# Patient Record
Sex: Male | Born: 2000 | Race: White | Hispanic: No | Marital: Single | Smoking: Current some day smoker
Health system: Southern US, Community
[De-identification: ages and names within clinical notes are randomized; demographics above are authoritative.]

---

## 2018-07-13 ENCOUNTER — Other Ambulatory Visit: Payer: Self-pay

## 2018-07-13 ENCOUNTER — Emergency Department: Payer: BLUE CROSS/BLUE SHIELD

## 2018-07-13 ENCOUNTER — Encounter: Payer: Self-pay | Admitting: Emergency Medicine

## 2018-07-13 ENCOUNTER — Emergency Department
Admission: EM | Admit: 2018-07-13 | Discharge: 2018-07-13 | Disposition: A | Discharge status: Home / Self Care | Payer: BLUE CROSS/BLUE SHIELD | Attending: Emergency Medicine | Admitting: Emergency Medicine

## 2018-07-13 DIAGNOSIS — R509 Fever, unspecified: Secondary | ICD-10-CM | POA: Diagnosis present

## 2018-07-13 DIAGNOSIS — J020 Streptococcal pharyngitis: Secondary | ICD-10-CM | POA: Diagnosis not present

## 2018-07-13 DIAGNOSIS — F17210 Nicotine dependence, cigarettes, uncomplicated: Secondary | ICD-10-CM | POA: Diagnosis not present

## 2018-07-13 LAB — INFLUENZA PANEL BY PCR (TYPE A & B)
Influenza A By PCR: NEGATIVE
Influenza B By PCR: NEGATIVE

## 2018-07-13 LAB — GROUP A STREP BY PCR: Group A Strep by PCR: DETECTED — AB

## 2018-07-13 MED ORDER — AMOXICILLIN 875 MG PO TABS: 875.0000 mg | ORAL_TABLET | Freq: Two times a day (BID) | ORAL | 0 refills | Status: AC

## 2018-07-13 MED ORDER — AMOXICILLIN 500 MG PO CAPS
500.0000 mg | ORAL_CAPSULE | Freq: Once | ORAL | Status: AC
  Administered 2018-07-13: 500 mg via ORAL
  Filled 2018-07-13: qty 1

## 2018-07-13 NOTE — ED Triage Notes (Signed)
Pt presents to ED with fever and sore throat for the past 5 days. otc medications not helping.

## 2018-07-13 NOTE — ED Notes (Signed)
Pt returned from xray

## 2018-07-13 NOTE — ED Provider Notes (Signed)
[facility] Emergency Department Provider Note  ____________________________________________  Time seen: Approximately 11:01 PM  I have reviewed the triage vital signs and the nursing notes.   HISTORY  Chief Complaint Fever    HPI Wesley Dominguez is a 18 y.o. male presents to the emergency department with pharyngitis, fever anterior neck discomfort for approximately 5 days.  Patient has had mild headache but no abdominal discomfort.  No associated rhinorrhea, congestion or nonproductive cough.  Patient is resting comfortably in the emergency department.  He is tolerating fluids by mouth and his own secretions.  He speaks in complete sentences without difficulty.  No voice changes.  No prior history of peritonsillar or retropharyngeal abscesses.   History reviewed. No pertinent past medical history.  There are no active problems to display for this patient.   History reviewed. No pertinent surgical history.  Prior to Admission medications   Medication Sig Start Date End Date Taking? Authorizing Provider  amoxicillin (AMOXIL) 875 MG tablet Take 1 tablet (875 mg total) by mouth 2 (two) times daily for 7 days. 07/13/18 07/20/18  Orvil Feil, PA-C    Allergies Patient has no known allergies.  No family history on file.  Social History Social History   Tobacco Use  . Smoking status: Current Some Day Smoker    Types: Cigarettes  . Smokeless tobacco: Never Used  Substance Use Topics  . Alcohol use: Yes  . Drug use: Not Currently     Review of Systems  Constitutional: Patient has fever and chills.  Eyes: No visual changes. No discharge ENT: Patient has pharyngitis.  Cardiovascular: no chest pain. Respiratory: no cough. No SOB. Gastrointestinal: No abdominal pain.  No nausea, no vomiting.  No diarrhea.  No constipation. Genitourinary: Negative for dysuria. No hematuria Skin: Negative for rash, abrasions, lacerations, ecchymosis. Neurological:  Negative for headaches, focal weakness or numbness.   ____________________________________________   PHYSICAL EXAM:  VITAL SIGNS: ED Triage Vitals  Enc Vitals Group     BP 07/13/18 2103 132/67     Pulse Rate 07/13/18 2103 71     Resp 07/13/18 2103 20     Temp 07/13/18 2103 99.3 F (37.4 C)     Temp src --      SpO2 07/13/18 2103 99 %     Weight 07/13/18 2103 142 lb (64.4 kg)     Height 07/13/18 2103 5\' 7"  (1.702 m)     Head Circumference --      Peak Flow --      Pain Score 07/13/18 2106 7     Pain Loc --      Pain Edu? --      Excl. in GC? --      Constitutional: Alert and oriented. Well appearing and in no acute distress. Eyes: Conjunctivae are normal. PERRL. EOMI. Head: Atraumatic. ENT:      Ears: TMs are pearly.      Nose: No congestion/rhinnorhea.      Mouth/Throat: Mucous membranes are moist.  Posterior pharynx is erythematous with bilateral tonsillar hypertrophy.  No tonsillar exudate.  Posterior pharynx is erythematous.  Uvula is midline. Hematological/Lymphatic/Immunilogical: Palpable cervical lymphadenopathy.  Cardiovascular: Normal rate, regular rhythm. Normal S1 and S2.  Good peripheral circulation. Respiratory: Normal respiratory effort without tachypnea or retractions. Lungs CTAB. Good air entry to the bases with no decreased or absent breath sounds. Skin:  Skin is warm, dry and intact. No rash noted. Psychiatric: Mood and affect are normal. Speech and behavior are normal.  Patient exhibits appropriate insight and judgement.   ____________________________________________   LABS (all labs ordered are listed, but only abnormal results are displayed)  Labs Reviewed  GROUP A STREP BY PCR - Abnormal; Notable for the following components:      Result Value   Group A Strep by PCR DETECTED (*)    All other components within normal limits  INFLUENZA PANEL BY PCR (TYPE A & B)    ____________________________________________  EKG   ____________________________________________  RADIOLOGY I personally viewed and evaluated these images as part of my medical decision making, as well as reviewing the written report by the radiologist.  Dg Chest 2 View  Result Date: 07/13/2018 CLINICAL DATA:  Fever and sore throat EXAM: CHEST - 2 VIEW COMPARISON:  None. FINDINGS: The heart size and mediastinal contours are within normal limits. Both lungs are clear. The visualized skeletal structures are unremarkable. IMPRESSION: No active cardiopulmonary disease. Electronically Signed   By: Jasmine Pang M.D.   On: 07/13/2018 21:37    ____________________________________________    PROCEDURES  Procedure(s) performed:    Procedures    Medications  amoxicillin (AMOXIL) capsule 500 mg (500 mg Oral Given 07/13/18 2231)     ____________________________________________   INITIAL IMPRESSION / ASSESSMENT AND PLAN / ED COURSE  Pertinent labs & imaging results that were available during my care of the patient were reviewed by me and considered in my medical decision making (see chart for details).  Review of the [place] CSRS was performed in accordance of the [facility] prior to dispensing any controlled drugs.      Assessment and plan:  Strep throat Patient presents to the emergency department with pharyngitis and fever for the past 5 days.  On physical exam, patient had no nuchal rigidity.  He was talking in complete sentences with no voice changes.  No swelling at the neck.  Patient tested positive for group A strep in the emergency department.  Patient was given his first dose of amoxicillin in the emergency department.  He was discharged with amoxicillin.  Tylenol was recommended for pharyngitis.  All patient questions were answered.    ____________________________________________  FINAL CLINICAL IMPRESSION(S) / ED DIAGNOSES  Final diagnoses:  Strep throat      NEW  MEDICATIONS STARTED DURING THIS VISIT:  ED Discharge Orders         Ordered    amoxicillin (AMOXIL) 875 MG tablet  2 times daily     07/13/18 2228              This chart was dictated using voice recognition software/Dragon. Despite best efforts to proofread, errors can occur which can change the meaning. Any change was purely unintentional.    Orvil Feil, PA-C 07/13/18 2305    Rockne Menghini, MD 07/17/18 [phone]

## 2018-07-13 NOTE — ED Notes (Signed)
Pt ambulatory to xray.

## 2019-02-16 ENCOUNTER — Other Ambulatory Visit: Payer: Self-pay

## 2019-02-16 DIAGNOSIS — Z20822 Contact with and (suspected) exposure to covid-19: Secondary | ICD-10-CM

## 2019-02-17 LAB — NOVEL CORONAVIRUS, NAA: SARS-CoV-2, NAA: NOT DETECTED

## 2019-02-19 ENCOUNTER — Telehealth: Payer: Self-pay

## 2019-02-19 NOTE — Telephone Encounter (Signed)
Patient called in requesting [place] lab results - DOB/Address verified - results given. Assisted with MyChart set up, no further questions.

## 2019-08-05 ENCOUNTER — Other Ambulatory Visit: Payer: Self-pay | Admitting: Medical

## 2019-08-08 LAB — CHLAMYDIA/GONOCOCCUS/TRICHOMONAS, NAA
Chlamydia by NAA: NEGATIVE
Gonococcus by NAA: POSITIVE — AB
Trich vag by NAA: NEGATIVE

## 2020-03-30 ENCOUNTER — Emergency Department
Admission: EM | Admit: 2020-03-30 | Discharge: 2020-03-30 | Disposition: A | Discharge status: Home / Self Care | Payer: BC Managed Care – PPO | Attending: Emergency Medicine | Admitting: Emergency Medicine

## 2020-03-30 ENCOUNTER — Emergency Department: Payer: BC Managed Care – PPO

## 2020-03-30 ENCOUNTER — Other Ambulatory Visit: Payer: Self-pay

## 2020-03-30 ENCOUNTER — Encounter: Payer: Self-pay | Admitting: Emergency Medicine

## 2020-03-30 DIAGNOSIS — B2790 Infectious mononucleosis, unspecified without complication: Secondary | ICD-10-CM | POA: Insufficient documentation

## 2020-03-30 DIAGNOSIS — F17210 Nicotine dependence, cigarettes, uncomplicated: Secondary | ICD-10-CM | POA: Insufficient documentation

## 2020-03-30 DIAGNOSIS — M545 Low back pain, unspecified: Secondary | ICD-10-CM | POA: Insufficient documentation

## 2020-03-30 DIAGNOSIS — Z20822 Contact with and (suspected) exposure to covid-19: Secondary | ICD-10-CM | POA: Diagnosis not present

## 2020-03-30 DIAGNOSIS — R059 Cough, unspecified: Secondary | ICD-10-CM | POA: Diagnosis present

## 2020-03-30 LAB — CBC
HCT: 46 % (ref 39.0–52.0)
Hemoglobin: 15.3 g/dL (ref 13.0–17.0)
MCH: 29.4 pg (ref 26.0–34.0)
MCHC: 33.3 g/dL (ref 30.0–36.0)
MCV: 88.3 fL (ref 80.0–100.0)
Platelets: 197 10*3/uL (ref 150–400)
RBC: 5.21 MIL/uL (ref 4.22–5.81)
RDW: 12 % (ref 11.5–15.5)
WBC: 9.6 10*3/uL (ref 4.0–10.5)
nRBC: 0 % (ref 0.0–0.2)

## 2020-03-30 LAB — MONONUCLEOSIS SCREEN: Mono Screen: POSITIVE — AB

## 2020-03-30 LAB — FIBRIN DERIVATIVES D-DIMER (ARMC ONLY): Fibrin derivatives D-dimer (ARMC): 119.13 ng/mL (FEU) (ref 0.00–499.00)

## 2020-03-30 LAB — BASIC METABOLIC PANEL
Anion gap: 9 (ref 5–15)
BUN: 20 mg/dL (ref 6–20)
CO2: 29 mmol/L (ref 22–32)
Calcium: 9.7 mg/dL (ref 8.9–10.3)
Chloride: 99 mmol/L (ref 98–111)
Creatinine, Ser: 1.34 mg/dL — ABNORMAL HIGH (ref 0.61–1.24)
GFR, Estimated: >60 mL/min (ref >60)
Glucose, Bld: 81 mg/dL (ref 70–99)
Potassium: 4.2 mmol/L (ref 3.5–5.1)
Sodium: 137 mmol/L (ref 135–145)

## 2020-03-30 LAB — TROPONIN I (HIGH SENSITIVITY): Troponin I (High Sensitivity): <2 ng/L (ref <18)

## 2020-03-30 LAB — RESPIRATORY PANEL BY RT PCR (FLU A&B, COVID)
Influenza A by PCR: NEGATIVE
Influenza B by PCR: NEGATIVE
SARS Coronavirus 2 by RT PCR: NEGATIVE

## 2020-03-30 MED ORDER — LIDOCAINE VISCOUS HCL 2 % MT SOLN: 10.0000 mL | OROMUCOSAL | 0 refills | Status: AC | PRN

## 2020-03-30 NOTE — ED Triage Notes (Signed)
Pt arrived via POV with c/o cough x 2 weeks and stabbing upper back pain. No distress noted on arrival, pt able to speak in complete sentences without running out of breath.

## 2020-03-30 NOTE — ED Notes (Signed)
Pt ambulatory to triage room, NAD- pt having cough x2 weeks and back pain

## 2020-03-30 NOTE — ED Provider Notes (Signed)
[facility] Emergency Department Provider Note  ____________________________________________  Time seen: Approximately 1:07 PM  I have reviewed the triage vital signs and the nursing notes.   HISTORY  Chief Complaint Cough and Back Pain    HPI Wesley Dominguez is a 19 y.o. male that presents to the emergency department for evaluation of fatigue, upper back pain, productive cough with phlegm for 2 weeks.  Patient states that symptoms started out as a tickle to the front of his throat.  Upper back pain is worse when he coughs.  He has several sick contacts.  A couple of his friends have mono.  When he started feeling bad a couple of weeks ago, he thought that it was his lifestyle so he started eating healthier and working out.  He has not checked his temperature.  No nasal congestion, sore throat, vomiting.   History reviewed. No pertinent past medical history.  There are no problems to display for this patient.   History reviewed. No pertinent surgical history.  Prior to Admission medications   Medication Sig Start Date End Date Taking? Authorizing Provider  lidocaine (XYLOCAINE) 2 % solution Use as directed 10 mLs in the mouth or throat as needed. 03/30/20   Enid Derry, PA-C    Allergies Patient has no known allergies.  No family history on file.  Social History Social History   Tobacco Use  . Smoking status: Current Some Day Smoker    Types: Cigarettes  . Smokeless tobacco: Never Used  Vaping Use  . Vaping Use: Never used  Substance Use Topics  . Alcohol use: Yes  . Drug use: Not Currently     Review of Systems  Constitutional: No fever/chills ENT: No upper respiratory complaints. Cardiovascular: No chest pain. Respiratory: Positive for cough. No SOB. Gastrointestinal: No abdominal pain.  No nausea, no vomiting.  Musculoskeletal: Positive for back pain. Skin: Negative for rash, abrasions, lacerations, ecchymosis. Neurological: Negative  for headaches   ____________________________________________   PHYSICAL EXAM:  VITAL SIGNS: ED Triage Vitals  Enc Vitals Group     BP 03/30/20 1157 125/62     Pulse Rate 03/30/20 1157 82     Resp 03/30/20 1157 16     Temp 03/30/20 1157 97.9 F (36.6 C)     Temp Source 03/30/20 1157 Oral     SpO2 03/30/20 1157 100 %     Weight 03/30/20 1156 135 lb (61.2 kg)     Height 03/30/20 1156 5\' 7"  (1.702 m)     Head Circumference --      Peak Flow --      Pain Score 03/30/20 1156 6     Pain Loc --      Pain Edu? --      Excl. in GC? --      Constitutional: Alert and oriented. Well appearing and in no acute distress.  Working on a laptop. Eyes: Conjunctivae are normal. PERRL. EOMI. Head: Atraumatic. ENT:      Ears:      Nose: No congestion/rhinnorhea.      Mouth/Throat: Mucous membranes are moist.  Oropharynx mildly erythematous.  Tonsils not enlarged.  Uvula midline. Neck: No stridor.  Cardiovascular: Normal rate, regular rhythm.  Good peripheral circulation. Respiratory: Normal respiratory effort without tachypnea or retractions. Lungs CTAB. Good air entry to the bases with no decreased or absent breath sounds. Gastrointestinal: Bowel sounds 4 quadrants. Soft and nontender to palpation. No guarding or rigidity. No palpable masses. No distention.  Musculoskeletal: Full  range of motion to all extremities. No gross deformities appreciated. Neurologic:  Normal speech and language. No gross focal neurologic deficits are appreciated.  Skin:  Skin is warm, dry and intact. No rash noted. Psychiatric: Mood and affect are normal. Speech and behavior are normal. Patient exhibits appropriate insight and judgement.   ____________________________________________   LABS (all labs ordered are listed, but only abnormal results are displayed)  Labs Reviewed  BASIC METABOLIC PANEL - Abnormal; Notable for the following components:      Result Value   Creatinine, Ser 1.34 (*)    All other  components within normal limits  MONONUCLEOSIS SCREEN - Abnormal; Notable for the following components:   Mono Screen POSITIVE (*)    All other components within normal limits  RESPIRATORY PANEL BY RT PCR (FLU A&B, COVID)  CBC  FIBRIN DERIVATIVES D-DIMER ([facility] ONLY)  TROPONIN I (HIGH SENSITIVITY)  TROPONIN I (HIGH SENSITIVITY)   ____________________________________________  EKG  SR ____________________________________________  RADIOLOGY Lexine Baton, personally viewed and evaluated these images (plain radiographs) as part of my medical decision making, as well as reviewing the written report by the radiologist.  DG Chest 2 View  Result Date: 03/30/2020 CLINICAL DATA:  Patient complains of cough x 2 weeks and stabbing upper back pain. Patient state ex Vaper as of 1 month ago. EXAM: CHEST - 2 VIEW COMPARISON:  Chest radiograph 07/13/2018 FINDINGS: The cardiomediastinal contours are within normal limits. The lungs are clear. No pneumothorax or pleural effusion. No acute finding in the visualized skeleton. IMPRESSION: No evidence of active disease. Electronically Signed   By: Emmaline Kluver M.D.   On: 03/30/2020 13:04    ____________________________________________    PROCEDURES  Procedure(s) performed:    Procedures    Medications - No data to display   ____________________________________________   INITIAL IMPRESSION / ASSESSMENT AND PLAN / ED COURSE  Pertinent labs & imaging results that were available during my care of the patient were reviewed by me and considered in my medical decision making (see chart for details).  Review of the [place] CSRS was performed in accordance of the [facility] prior to dispensing any controlled drugs.     Patient presented to the emergency department for evaluation of cough and back pain for 2 weeks.  Vital signs and exam are reassuring.  Chest x-ray negative for acute cardiopulmonary processes.  Normal sinus rhythm on EKG.  Lab work is  unremarkable.  Troponin and D-dimer within normal limits.  Mono test is positive.  Cough is likely due to the throat irritation.  Patient is to follow up with primary care as directed. Patient is given ED precautions to return to the ED for any worsening or new symptoms.   Wesley Dominguez was evaluated in Emergency Department on 03/30/2020 for the symptoms described in the history of present illness. He was evaluated in the context of the global COVID-19 pandemic, which necessitated consideration that the patient might be at risk for infection with the SARS-CoV-2 virus that causes COVID-19. Institutional protocols and algorithms that pertain to the evaluation of patients at risk for COVID-19 are in a state of rapid change based on information released by regulatory bodies including the CDC and federal and state organizations. These policies and algorithms were followed during the patient's care in the ED.  ____________________________________________  FINAL CLINICAL IMPRESSION(S) / ED DIAGNOSES  Final diagnoses:  Infectious mononucleosis without complication, infectious mononucleosis due to unspecified organism      NEW MEDICATIONS STARTED DURING THIS VISIT:  ED Discharge Orders         Ordered    lidocaine (XYLOCAINE) 2 % solution  As needed        03/30/20 1548              This chart was dictated using voice recognition software/Dragon. Despite best efforts to proofread, errors can occur which can change the meaning. Any change was purely unintentional.    Enid Derry, PA-C 03/30/20 1714    Gilles Chiquito, MD 03/31/20 [phone]

## 2022-05-13 IMAGING — CR DG CHEST 2V
2 series · 2 of 2 positions shown · non-contrast
Comparison: Chest radiograph 07/13/2018

CLINICAL DATA: Patient complains of cough x 2 weeks and stabbing
upper back pain. Patient state ex Vaper as of 1 month ago.

EXAM:
CHEST - 2 VIEW

[chest pa]
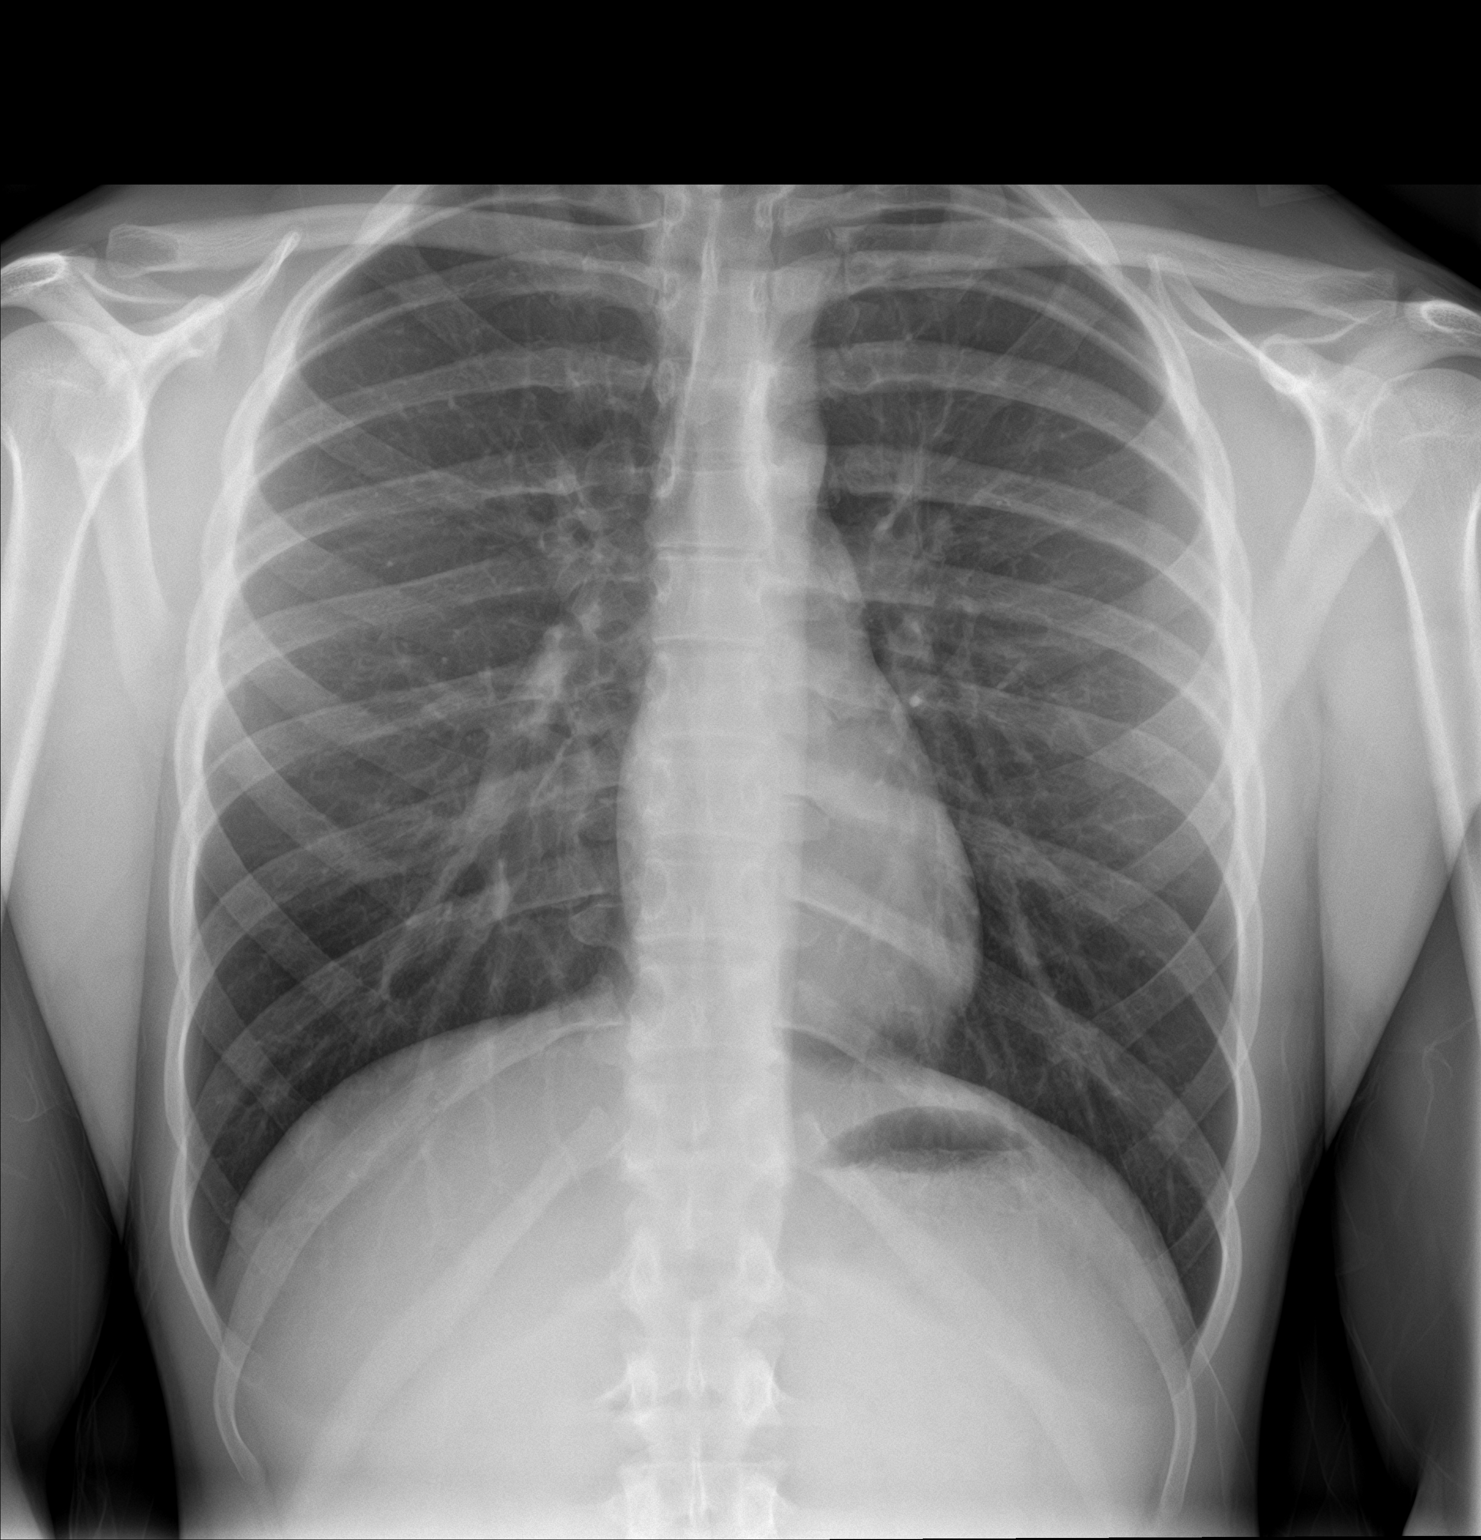

[chest lat]
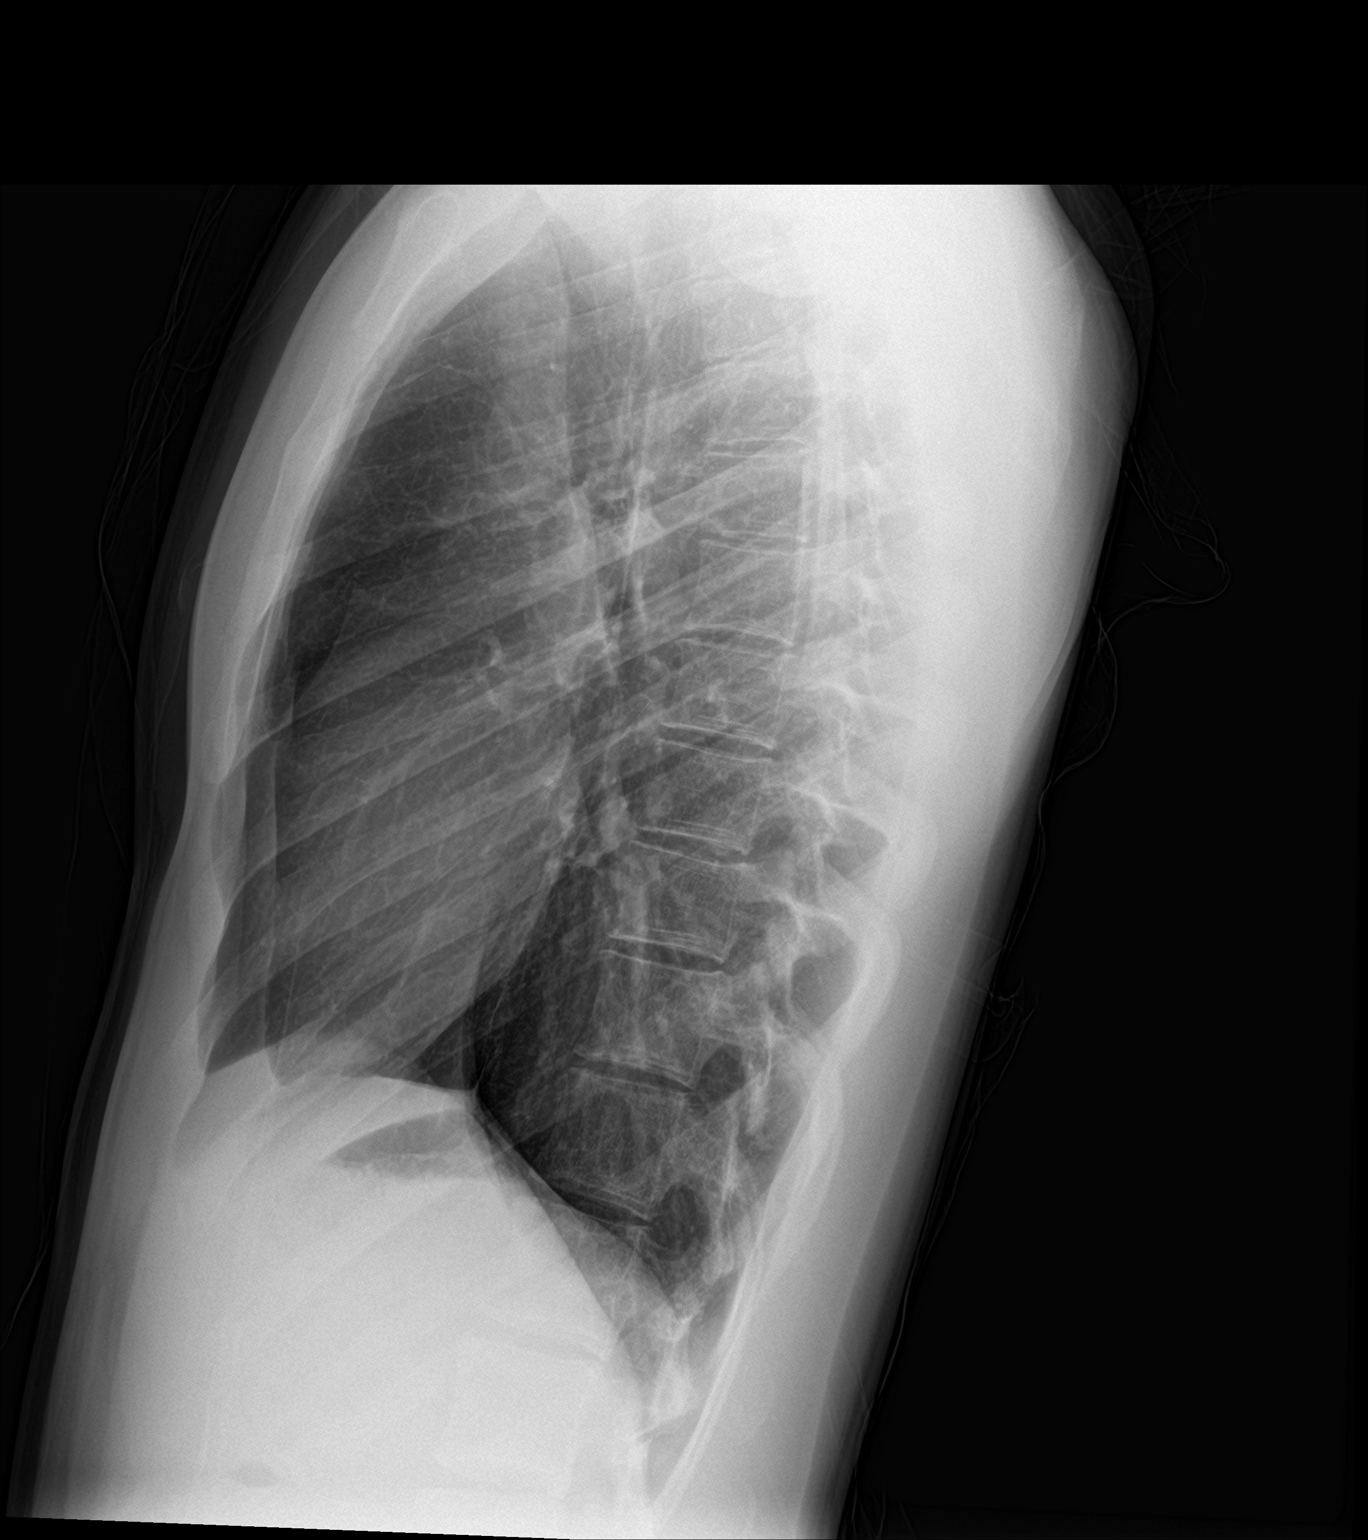

[2 of 2 positions shown; findings below may reference images not displayed]

FINDINGS: The cardiomediastinal contours are within normal limits. The lungs
are clear. No pneumothorax or pleural effusion. No acute finding in
the visualized skeleton.
IMPRESSION: No evidence of active disease.
